# Patient Record
Sex: Female | Born: 1950 | Race: White | Hispanic: No | Marital: Single | Smoking: Never smoker
Health system: Southern US, Community
[De-identification: ages and names within clinical notes are randomized; demographics above are authoritative.]

## PROBLEM LIST (undated history)

## (undated) DIAGNOSIS — I10 Essential (primary) hypertension: Secondary | ICD-10-CM

## (undated) DIAGNOSIS — E079 Disorder of thyroid, unspecified: Secondary | ICD-10-CM

## (undated) DIAGNOSIS — I4891 Unspecified atrial fibrillation: Secondary | ICD-10-CM

## (undated) HISTORY — PX: COLON SURGERY: SHX602

## (undated) HISTORY — PX: THYROIDECTOMY, PARTIAL: SHX18

## (undated) HISTORY — PX: ABDOMINAL SURGERY: SHX537

## (undated) HISTORY — PX: ABDOMINAL HYSTERECTOMY: SHX81

## (undated) HISTORY — PX: OTHER SURGICAL HISTORY: SHX169

## (undated) HISTORY — PX: CARDIAC SURGERY: SHX584

## (undated) HISTORY — PX: BACK SURGERY: SHX140

## (undated) HISTORY — PX: HERNIA REPAIR: SHX51

## (undated) HISTORY — PX: JOINT REPLACEMENT: SHX530

---

## 2012-11-02 ENCOUNTER — Other Ambulatory Visit: Payer: Self-pay | Admitting: Obstetrics and Gynecology

## 2012-11-02 DIAGNOSIS — Z1231 Encounter for screening mammogram for malignant neoplasm of breast: Secondary | ICD-10-CM

## 2012-11-25 ENCOUNTER — Ambulatory Visit: Payer: Self-pay

## 2012-12-07 ENCOUNTER — Ambulatory Visit (INDEPENDENT_AMBULATORY_CARE_PROVIDER_SITE_OTHER): Payer: Federal, State, Local not specified - PPO

## 2012-12-07 DIAGNOSIS — Z1231 Encounter for screening mammogram for malignant neoplasm of breast: Secondary | ICD-10-CM

## 2013-11-22 ENCOUNTER — Other Ambulatory Visit: Payer: Self-pay | Admitting: Obstetrics and Gynecology

## 2013-11-22 DIAGNOSIS — Z1231 Encounter for screening mammogram for malignant neoplasm of breast: Secondary | ICD-10-CM

## 2013-11-23 ENCOUNTER — Other Ambulatory Visit: Payer: Self-pay | Admitting: Obstetrics and Gynecology

## 2013-12-08 ENCOUNTER — Ambulatory Visit (INDEPENDENT_AMBULATORY_CARE_PROVIDER_SITE_OTHER): Payer: Federal, State, Local not specified - PPO

## 2013-12-08 DIAGNOSIS — Z1231 Encounter for screening mammogram for malignant neoplasm of breast: Secondary | ICD-10-CM

## 2014-12-11 ENCOUNTER — Other Ambulatory Visit: Payer: Self-pay | Admitting: Obstetrics and Gynecology

## 2014-12-11 DIAGNOSIS — Z1231 Encounter for screening mammogram for malignant neoplasm of breast: Secondary | ICD-10-CM

## 2014-12-14 ENCOUNTER — Ambulatory Visit (INDEPENDENT_AMBULATORY_CARE_PROVIDER_SITE_OTHER): Payer: Federal, State, Local not specified - PPO

## 2014-12-14 DIAGNOSIS — Z1231 Encounter for screening mammogram for malignant neoplasm of breast: Secondary | ICD-10-CM

## 2015-11-28 ENCOUNTER — Other Ambulatory Visit: Payer: Self-pay | Admitting: Obstetrics and Gynecology

## 2015-11-28 DIAGNOSIS — Z1231 Encounter for screening mammogram for malignant neoplasm of breast: Secondary | ICD-10-CM

## 2015-12-26 ENCOUNTER — Ambulatory Visit (INDEPENDENT_AMBULATORY_CARE_PROVIDER_SITE_OTHER): Payer: Federal, State, Local not specified - PPO

## 2015-12-26 DIAGNOSIS — Z1231 Encounter for screening mammogram for malignant neoplasm of breast: Secondary | ICD-10-CM

## 2016-02-20 ENCOUNTER — Encounter: Payer: Self-pay | Admitting: Emergency Medicine

## 2016-02-20 ENCOUNTER — Emergency Department
Admission: EM | Admit: 2016-02-20 | Discharge: 2016-02-20 | Disposition: A | Discharge status: Home / Self Care | Payer: Federal, State, Local not specified - PPO | Source: Home / Self Care | Attending: Family Medicine | Admitting: Family Medicine

## 2016-02-20 DIAGNOSIS — J9801 Acute bronchospasm: Secondary | ICD-10-CM

## 2016-02-20 DIAGNOSIS — B9789 Other viral agents as the cause of diseases classified elsewhere: Secondary | ICD-10-CM

## 2016-02-20 DIAGNOSIS — J069 Acute upper respiratory infection, unspecified: Secondary | ICD-10-CM

## 2016-02-20 DIAGNOSIS — J028 Acute pharyngitis due to other specified organisms: Secondary | ICD-10-CM | POA: Diagnosis not present

## 2016-02-20 HISTORY — DX: Disorder of thyroid, unspecified: E07.9

## 2016-02-20 HISTORY — DX: Unspecified atrial fibrillation: I48.91

## 2016-02-20 HISTORY — DX: Essential (primary) hypertension: I10

## 2016-02-20 LAB — POCT RAPID STREP A (OFFICE): Rapid Strep A Screen: NEGATIVE

## 2016-02-20 MED ORDER — PREDNISONE 20 MG PO TABS: ORAL_TABLET | ORAL | 0 refills | Status: DC

## 2016-02-20 MED ORDER — DOXYCYCLINE HYCLATE 100 MG PO CAPS: 100.0000 mg | ORAL_CAPSULE | Freq: Two times a day (BID) | ORAL | 0 refills | Status: DC

## 2016-02-20 MED ORDER — BENZONATATE 200 MG PO CAPS: 200.0000 mg | ORAL_CAPSULE | Freq: Every day | ORAL | 0 refills | Status: DC

## 2016-02-20 NOTE — ED Provider Notes (Signed)
[facility]    CSN: [id number] Arrival date & time: 02/20/16  1307     History   Chief Complaint Chief Complaint  Patient presents with  . URI    HPI Jessy OtoBarbara Cuff is a 65 y.o. female.   Patient complains of three day history of typical cold-like symptoms developing over several days, including mild sore throat, sinus congestion, headache, fatigue, myalgias, and cough.  Today she has developed wheezing with cough.  She notes that she always has prolonged symptoms when she gets a cold, and in the past has been prescribed an albuterol inhaler and nebulizer for prn use.   The history is provided by the patient.    Past Medical History:  Diagnosis Date  . Hypertension   . Thyroid disease     There are no active problems to display for this patient.   Past Surgical History:  Procedure Laterality Date  . ABDOMINAL HYSTERECTOMY    . ABDOMINAL SURGERY    . BACK SURGERY    . COLON SURGERY    . HERNIA REPAIR    . JOINT REPLACEMENT    . THYROIDECTOMY, PARTIAL    . vaginal reconscruction      OB History    No data available       Home Medications    Prior to Admission medications   Medication Sig Start Date End Date Taking? Authorizing Provider  levothyroxine (SYNTHROID, LEVOTHROID) 75 MCG tablet Take 75 mcg by mouth daily before breakfast.   Yes Historical Provider, MD  losartan-hydrochlorothiazide (HYZAAR) 100-25 MG tablet Take 1 tablet by mouth daily.   Yes Historical Provider, MD  benzonatate (TESSALON) 200 MG capsule Take 1 capsule (200 mg total) by mouth at bedtime. Take as needed for cough 02/20/16   Lattie HawStephen A Rodert Hinch, MD  doxycycline (VIBRAMYCIN) 100 MG capsule Take 1 capsule (100 mg total) by mouth 2 (two) times daily. Take with food (Rx void after 02/28/16) 02/20/16   Lattie HawStephen A Nessie Nong, MD  predniSONE (DELTASONE) 20 MG tablet Take one tab by mouth twice daily for 5 days, then one daily. Take with food. 02/20/16   Lattie HawStephen A Jaymie Mckiddy, MD    Family  History Family History  Problem Relation Age of Onset  . Eosinophilic granuloma Mother   . Diabetes Father   . Hypertension Father   . COPD Father   . Heart failure Father     Social History Social History  Substance Use Topics  . Smoking status: Never Smoker  . Smokeless tobacco: Never Used  . Alcohol use No     Allergies   Codeine; Iodine; Macrobid [nitrofurantoin]; Morphine and related; and Penicillins   Review of Systems Review of Systems + sore throat + cough + sneezing No pleuritic pain + wheezing + nasal congestion + post-nasal drainage No sinus pain/pressure No itchy/red eyes ? earache No hemoptysis No SOB No fever, + chills No nausea No vomiting No abdominal pain No diarrhea No urinary symptoms No skin rash + fatigue + myalgias + headache Used OTC meds without relief   Physical Exam Triage Vital Signs ED Triage Vitals  Enc Vitals Group     BP 02/20/16 1336 145/90     Pulse Rate 02/20/16 1336 93     Resp --      Temp 02/20/16 1336 98.2 F (36.8 C)     Temp Source 02/20/16 1336 Oral     SpO2 02/20/16 1336 96 %     Weight 02/20/16 1337 239 lb (108.4  kg)     Height 02/20/16 1337 5\' 2"  (1.575 m)     Head Circumference --      Peak Flow --      Pain Score 02/20/16 1350 2     Pain Loc --      Pain Edu? --      Excl. in GC? --    No data found.   Updated Vital Signs BP 145/90 (BP Location: Left Arm)   Pulse 93   Temp 98.2 F (36.8 C) (Oral)   Ht 5\' 2"  (1.575 m)   Wt 239 lb (108.4 kg)   SpO2 96%   BMI 43.71 kg/m   Visual Acuity Right Eye Distance:   Left Eye Distance:   Bilateral Distance:    Right Eye Near:   Left Eye Near:    Bilateral Near:     Physical Exam Nursing notes and Vital Signs reviewed. Appearance:  Patient appears stated age, and in no acute distress Eyes:  Pupils are equal, round, and reactive to light and accomodation.  Extraocular movement is intact.  Conjunctivae are not inflamed  Ears:  Canals normal.   Tympanic membranes normal.  Nose:  Mildly congested turbinates.  No sinus tenderness.  Pharynx:  Uvula erythematous, otherwise normal. Neck:  Supple.  Tender enlarged posterior/lateral nodes are palpated bilaterally.  Tonsillar nodes also tender but not enlarged.  Lungs:  Clear to auscultation.  Breath sounds are equal.  Moving air well. Heart:  Regular rate and rhythm without murmurs, rubs, or gallops.  Abdomen:  Nontender without masses or hepatosplenomegaly.  Bowel sounds are present.  No CVA or flank tenderness.  Extremities:  No edema.  Skin:  No rash present.    UC Treatments / Results  Labs (all labs ordered are listed, but only abnormal results are displayed) Labs Reviewed  STREP A DNA PROBE negative  POCT RAPID STREP A (OFFICE)     EKG  EKG Interpretation None       Radiology No results found.  Procedures Procedures (including critical care time)  Medications Ordered in UC Medications - No data to display   Initial Impression / Assessment and Plan / UC Course  I have reviewed the triage vital signs and the nursing notes.  Pertinent labs & imaging results that were available during my care of the patient were reviewed by me and considered in my medical decision making (see chart for details).  Clinical Course  Suspect an element of reactive airways disease that only manifests during a viral URI There is no evidence of bacterial infection today.   Begin prednisone burst/taper.  Prescription written for Benzonatate [facility]) to take at bedtime for night-time cough.  Take plain guaifenesin (1200mg  extended release tabs such as Mucinex) twice daily, with plenty of water, for cough and congestion. Get adequate rest.   Also recommend using saline nasal spray several times daily and saline nasal irrigation (AYR is a common brand).   Use vaporizer by bedside. Try warm salt water gargles for sore throat.  Stop all antihistamines for now, and other non-prescription  cough/cold preparations. Use albuterol inhaler as needed. Begin doxycycline if not improving about one week or if persistent fever develops (Given a prescription to hold, with an expiration date)  Follow-up with family doctor if not improving about10 days.      Final Clinical Impressions(s) / UC Diagnoses   Final diagnoses:  Pharyngitis due to other organism  Viral URI with cough  Bronchospasm    New Prescriptions  New Prescriptions   BENZONATATE (TESSALON) 200 MG CAPSULE    Take 1 capsule (200 mg total) by mouth at bedtime. Take as needed for cough   DOXYCYCLINE (VIBRAMYCIN) 100 MG CAPSULE    Take 1 capsule (100 mg total) by mouth 2 (two) times daily. Take with food (Rx void after 02/28/16)   PREDNISONE (DELTASONE) 20 MG TABLET    Take one tab by mouth twice daily for 5 days, then one daily. Take with food.     Lattie HawStephen A Jaqualyn Juday, MD 02/20/16 1501

## 2016-02-20 NOTE — ED Triage Notes (Signed)
Bi-lateral ear pain, cough, eyes watery, sinus pressure, neck aches x 3 days

## 2016-02-20 NOTE — Discharge Instructions (Signed)
Take plain guaifenesin (1200mg  extended release tabs such as Mucinex) twice daily, with plenty of water, for cough and congestion. Get adequate rest.   Also recommend using saline nasal spray several times daily and saline nasal irrigation (AYR is a common brand).   Use vaporizer by bedside. Try warm salt water gargles for sore throat.  Stop all antihistamines for now, and other non-prescription cough/cold preparations. Use albuterol inhaler as needed. Begin doxycycline if not improving about one week or if persistent fever develops   Follow-up with family doctor if not improving about10 days.

## 2016-02-21 LAB — STREP A DNA PROBE: GASP: NOT DETECTED

## 2016-02-22 ENCOUNTER — Telehealth: Payer: Self-pay | Admitting: *Deleted

## 2016-02-22 NOTE — Telephone Encounter (Signed)
Callback: Patients advised of negative TCX results.

## 2016-12-10 ENCOUNTER — Other Ambulatory Visit: Payer: Self-pay | Admitting: Obstetrics and Gynecology

## 2016-12-10 DIAGNOSIS — Z1231 Encounter for screening mammogram for malignant neoplasm of breast: Secondary | ICD-10-CM

## 2017-01-02 ENCOUNTER — Ambulatory Visit (INDEPENDENT_AMBULATORY_CARE_PROVIDER_SITE_OTHER): Payer: Federal, State, Local not specified - PPO

## 2017-01-02 DIAGNOSIS — Z1231 Encounter for screening mammogram for malignant neoplasm of breast: Secondary | ICD-10-CM | POA: Diagnosis not present

## 2017-03-05 ENCOUNTER — Emergency Department
Admission: EM | Admit: 2017-03-05 | Discharge: 2017-03-05 | Disposition: A | Discharge status: Home / Self Care | Payer: Federal, State, Local not specified - PPO | Source: Home / Self Care | Attending: Family Medicine | Admitting: Family Medicine

## 2017-03-05 ENCOUNTER — Other Ambulatory Visit: Payer: Self-pay

## 2017-03-05 ENCOUNTER — Encounter: Payer: Self-pay | Admitting: Emergency Medicine

## 2017-03-05 DIAGNOSIS — B9789 Other viral agents as the cause of diseases classified elsewhere: Secondary | ICD-10-CM | POA: Diagnosis not present

## 2017-03-05 DIAGNOSIS — J069 Acute upper respiratory infection, unspecified: Secondary | ICD-10-CM | POA: Diagnosis not present

## 2017-03-05 MED ORDER — PREDNISONE 20 MG PO TABS
ORAL_TABLET | ORAL | 0 refills | Status: DC
Start: 2017-03-05 — End: 2022-01-10

## 2017-03-05 MED ORDER — CEFDINIR 300 MG PO CAPS: 300.0000 mg | ORAL_CAPSULE | Freq: Two times a day (BID) | ORAL | 0 refills | Status: DC

## 2017-03-05 NOTE — Discharge Instructions (Signed)
Take plain guaifenesin (1200mg  extended release tabs such as Mucinex) twice daily, with plenty of water, for cough and congestion.  May add Pseudoephedrine (30mg , one or two every 4 to 6 hours) for sinus congestion.  Get adequate rest.   May use Afrin nasal spray (or generic oxymetazoline) each morning for about 5 days and then discontinue.  Also recommend using saline nasal spray several times daily and saline nasal irrigation (AYR is a common brand).  Use Flonase nasal spray each morning after using Afrin nasal spray and saline nasal irrigation. Try warm salt water gargles for sore throat.  Stop all antihistamines for now, and other non-prescription cough/cold preparations. May take Delsym Cough Suppressant at bedtime for nighttime cough.  Begin Cefdinir if not improving about one week or if persistent fever develops   Follow-up with family doctor if not improving about10 days.

## 2017-03-05 NOTE — ED Provider Notes (Signed)
[facility]    CSN: [id number] Arrival date & time: 03/05/17  1319     History   Chief Complaint Chief Complaint  Patient presents with  . Sinus Problem    HPI Leah Mclaughlin is a 66 y.o. female.   Four days ago patient developed soreness in her neck, fatigue scratchy throat, and sinus congestion.  Her congestion has become worse, and today she has developed low grade fever and bilateral ear fullness.  No cough.  She has a history of recurrent ear and sinus infections.   The history is provided by the patient.    Past Medical History:  Diagnosis Date  . A-fib (HCC)   . Hypertension   . Thyroid disease     There are no active problems to display for this patient.   Past Surgical History:  Procedure Laterality Date  . ABDOMINAL HYSTERECTOMY    . ABDOMINAL SURGERY    . BACK SURGERY    . CARDIAC SURGERY    . COLON SURGERY    . HERNIA REPAIR    . JOINT REPLACEMENT    . THYROIDECTOMY, PARTIAL    . vaginal reconscruction      OB History    No data available       Home Medications    Prior to Admission medications   Medication Sig Start Date End Date Taking? Authorizing Provider  cefdinir (OMNICEF) 300 MG capsule Take 1 capsule (300 mg total) 2 (two) times daily by mouth. (Rx void after 03/13/17) 03/05/17   Lattie HawBeese, Imojean Yoshino A, MD  levothyroxine (SYNTHROID, LEVOTHROID) 75 MCG tablet Take 75 mcg by mouth daily before breakfast.    [provider]  losartan-hydrochlorothiazide (HYZAAR) 100-25 MG tablet Take 1 tablet by mouth daily.    [provider]  predniSONE (DELTASONE) 20 MG tablet Take one tab by mouth twice daily for 4 days, then one daily for 3 days. Take with food. 03/05/17   Lattie HawBeese, Taylin Leder A, MD    Family History Family History  Problem Relation Age of Onset  . Eosinophilic granuloma Mother   . Diabetes Father   . Hypertension Father   . COPD Father   . Heart failure Father     Social History Social History    Tobacco Use  . Smoking status: Never Smoker  . Smokeless tobacco: Never Used  Substance Use Topics  . Alcohol use: No  . Drug use: No     Allergies   Codeine; Iodine; Macrobid [nitrofurantoin]; Morphine and related; and Penicillins   Review of Systems Review of Systems + sore throat No cough No pleuritic pain No wheezing + nasal congestion + post-nasal drainage + sinus pain/pressure No itchy/red eyes + earache No hemoptysis No SOB + low grade fever, + chills No nausea No vomiting No abdominal pain No diarrhea No urinary symptoms No skin rash + fatigue + mild myalgias + headache Used OTC meds without relief   Physical Exam Triage Vital Signs ED Triage Vitals  Enc Vitals Group     BP 03/05/17 1339 (!) 162/99     Pulse Rate 03/05/17 1339 77     Resp --      Temp 03/05/17 1339 97.9 F (36.6 C)     Temp Source 03/05/17 1339 Oral     SpO2 03/05/17 1339 96 %     Weight 03/05/17 1340 172 lb (78 kg)     Height 03/05/17 1340 5' (1.524 m)     Head Circumference --  Peak Flow --      Pain Score 03/05/17 1340 3     Pain Loc --      Pain Edu? --      Excl. in GC? --    No data found.  Updated Vital Signs BP (!) 162/99 (BP Location: Right Arm)   Pulse 77   Temp 97.9 F (36.6 C) (Oral)   Ht 5' (1.524 m)   Wt 172 lb (78 kg)   SpO2 96%   BMI 33.59 kg/m   Visual Acuity Right Eye Distance:   Left Eye Distance:   Bilateral Distance:    Right Eye Near:   Left Eye Near:    Bilateral Near:     Physical Exam Nursing notes and Vital Signs reviewed. Appearance:  Patient appears stated age, and in no acute distress Eyes:  Pupils are equal, round, and reactive to light and accomodation.  Extraocular movement is intact.  Conjunctivae are not inflamed  Ears:  Canals normal.  Tympanic membranes normal.  Nose:  Congested turbinates.  No sinus tenderness.   Pharynx:  Normal Neck:  Supple.  Enlarged posterior/lateral nodes are palpated bilaterally, tender  to palpation on the left.   Lungs:  Clear to auscultation.  Breath sounds are equal.  Moving air well. Heart:  Regular rate and rhythm without murmurs, rubs, or gallops.  Abdomen:  Nontender without masses or hepatosplenomegaly.  Bowel sounds are present.  No CVA or flank tenderness.  Extremities:  No edema.  Skin:  No rash present.    UC Treatments / Results  Labs (all labs ordered are listed, but only abnormal results are displayed) Labs Reviewed - No data to display  EKG  EKG Interpretation None       Radiology No results found.  Procedures Procedures (including critical care time)  Medications Ordered in UC Medications - No data to display   Initial Impression / Assessment and Plan / UC Course  I have reviewed the triage vital signs and the nursing notes.  Pertinent labs & imaging results that were available during my care of the patient were reviewed by me and considered in my medical decision making (see chart for details).    There is no evidence of bacterial infection today.   Because patient has history of recurrent ear and sinus infections, will begin prednisone burst/taper. Take plain guaifenesin (1200mg  extended release tabs such as Mucinex) twice daily, with plenty of water, for cough and congestion.  May add Pseudoephedrine (30mg , one or two every 4 to 6 hours) for sinus congestion.  Get adequate rest.   May use Afrin nasal spray (or generic oxymetazoline) each morning for about 5 days and then discontinue.  Also recommend using saline nasal spray several times daily and saline nasal irrigation (AYR is a common brand).  Use Flonase nasal spray each morning after using Afrin nasal spray and saline nasal irrigation. Try warm salt water gargles for sore throat.  Stop all antihistamines for now, and other non-prescription cough/cold preparations. May take Delsym Cough Suppressant at bedtime for nighttime cough.  Begin Cefdinir if not improving about one week or if  persistent fever develops (Given a prescription to hold, with an expiration date).  Note that patient has had no adverse effects from Keflex in the past. Follow-up with family doctor if not improving about10 days.     Final Clinical Impressions(s) / UC Diagnoses   Final diagnoses:  Viral URI with cough    ED Discharge Orders  Ordered    predniSONE (DELTASONE) 20 MG tablet     03/05/17 1400    cefdinir (OMNICEF) 300 MG capsule  2 times daily     03/05/17 1402          Lattie Haw, MD 03/05/17 1534

## 2017-03-05 NOTE — ED Triage Notes (Signed)
Sinus pain, pressure, facial pain, bi-lateral ear pain, runny nose x 3 days

## 2017-12-02 ENCOUNTER — Other Ambulatory Visit: Payer: Self-pay | Admitting: Obstetrics and Gynecology

## 2017-12-02 DIAGNOSIS — Z1231 Encounter for screening mammogram for malignant neoplasm of breast: Secondary | ICD-10-CM

## 2018-01-07 ENCOUNTER — Ambulatory Visit (INDEPENDENT_AMBULATORY_CARE_PROVIDER_SITE_OTHER): Payer: Federal, State, Local not specified - PPO

## 2018-01-07 DIAGNOSIS — Z1231 Encounter for screening mammogram for malignant neoplasm of breast: Secondary | ICD-10-CM

## 2018-12-30 ENCOUNTER — Other Ambulatory Visit: Payer: Self-pay | Admitting: Obstetrics and Gynecology

## 2018-12-30 DIAGNOSIS — Z1231 Encounter for screening mammogram for malignant neoplasm of breast: Secondary | ICD-10-CM

## 2019-01-27 ENCOUNTER — Ambulatory Visit (INDEPENDENT_AMBULATORY_CARE_PROVIDER_SITE_OTHER): Payer: Medicare Other

## 2019-01-27 ENCOUNTER — Other Ambulatory Visit: Payer: Self-pay

## 2019-01-27 DIAGNOSIS — Z1231 Encounter for screening mammogram for malignant neoplasm of breast: Secondary | ICD-10-CM

## 2020-06-19 ENCOUNTER — Other Ambulatory Visit: Payer: Self-pay | Admitting: Obstetrics and Gynecology

## 2020-06-19 DIAGNOSIS — Z1231 Encounter for screening mammogram for malignant neoplasm of breast: Secondary | ICD-10-CM

## 2020-06-22 ENCOUNTER — Ambulatory Visit: Payer: Medicare Other

## 2020-06-28 ENCOUNTER — Other Ambulatory Visit: Payer: Self-pay

## 2020-06-28 ENCOUNTER — Ambulatory Visit (INDEPENDENT_AMBULATORY_CARE_PROVIDER_SITE_OTHER): Payer: Medicare Other

## 2020-06-28 DIAGNOSIS — Z1231 Encounter for screening mammogram for malignant neoplasm of breast: Secondary | ICD-10-CM | POA: Diagnosis not present

## 2021-06-10 ENCOUNTER — Other Ambulatory Visit: Payer: Self-pay | Admitting: Obstetrics and Gynecology

## 2021-06-10 DIAGNOSIS — Z1231 Encounter for screening mammogram for malignant neoplasm of breast: Secondary | ICD-10-CM

## 2021-07-04 ENCOUNTER — Other Ambulatory Visit: Payer: Self-pay

## 2021-07-04 ENCOUNTER — Ambulatory Visit: Payer: Medicare Other

## 2021-07-04 ENCOUNTER — Ambulatory Visit (INDEPENDENT_AMBULATORY_CARE_PROVIDER_SITE_OTHER): Payer: Medicare Other

## 2021-07-04 DIAGNOSIS — Z1231 Encounter for screening mammogram for malignant neoplasm of breast: Secondary | ICD-10-CM

## 2022-01-10 ENCOUNTER — Ambulatory Visit
Admission: EM | Admit: 2022-01-10 | Discharge: 2022-01-10 | Disposition: A | Discharge status: Home / Self Care | Payer: Federal, State, Local not specified - PPO

## 2022-01-10 ENCOUNTER — Telehealth: Payer: Self-pay | Admitting: Urgent Care

## 2022-01-10 ENCOUNTER — Encounter: Payer: Self-pay | Admitting: *Deleted

## 2022-01-10 DIAGNOSIS — U071 COVID-19: Secondary | ICD-10-CM

## 2022-01-10 DIAGNOSIS — J029 Acute pharyngitis, unspecified: Secondary | ICD-10-CM

## 2022-01-10 DIAGNOSIS — M26609 Unspecified temporomandibular joint disorder, unspecified side: Secondary | ICD-10-CM | POA: Diagnosis not present

## 2022-01-10 DIAGNOSIS — H6983 Other specified disorders of Eustachian tube, bilateral: Secondary | ICD-10-CM

## 2022-01-10 LAB — POC SARS CORONAVIRUS 2 AG -  ED: SARS Coronavirus 2 Ag: POSITIVE — AB

## 2022-01-10 LAB — POC INFLUENZA A AND B ANTIGEN (URGENT CARE ONLY)
Influenza A Ag: NEGATIVE
Influenza B Ag: NEGATIVE

## 2022-01-10 LAB — POCT RAPID STREP A (OFFICE): Rapid Strep A Screen: NEGATIVE

## 2022-01-10 MED ORDER — MOLNUPIRAVIR EUA 200MG CAPSULE: 4.0000 | ORAL_CAPSULE | Freq: Two times a day (BID) | ORAL | 0 refills | Status: DC

## 2022-01-10 MED ORDER — MOLNUPIRAVIR EUA 200MG CAPSULE: 4.0000 | ORAL_CAPSULE | Freq: Two times a day (BID) | ORAL | 0 refills | Status: AC

## 2022-01-10 MED ORDER — BACLOFEN 10 MG PO TABS: 10.0000 mg | ORAL_TABLET | Freq: Three times a day (TID) | ORAL | 0 refills | Status: AC

## 2022-01-10 MED ORDER — FLUTICASONE PROPIONATE 50 MCG/ACT NA SUSP: 1.0000 | Freq: Every day | NASAL | 0 refills | Status: AC

## 2022-01-10 MED ORDER — MELOXICAM 7.5 MG PO TABS: 7.5000 mg | ORAL_TABLET | Freq: Every day | ORAL | 0 refills | Status: AC

## 2022-01-10 NOTE — ED Provider Notes (Signed)
[facility]    CSN: [id number] Arrival date & time: 01/10/22  0806      History   Chief Complaint Chief Complaint  Patient presents with   Sore Throat   Otalgia    HPI Leah Mclaughlin is a 71 y.o. female.   Pleasant 71 year old female presents today due to concerns of a 2 to 84-month history of left sided neck/ear pain, in combination with an acute onset of a sore throat last evening.  She states she saw her PCP and had a thyroid ultrasound performed on September 1 to further evaluate her neck pain.  She had a thyroidectomy 30 years ago, and was concerned that possibly her pain was related to a thyroid issue.  The ultrasound showed blood flow consistent with residual thyroid tissue, but no other acute findings noted.  Patient admits in addition to her neck pain, she has chronic left ear pain, and intermittent headaches.  She admits to clenching her teeth, is uncertain if she grinds at night.  Believes she has TMJ, but has never been treated.  States over the past 24 hours, she felt the development of a sore throat and change in voice as well.  Denies sick contacts or fever.  Has not found relief for her chronic neck and ear pain.     Sore Throat  Otalgia   Past Medical History:  Diagnosis Date   A-fib [facility])    Hypertension    Thyroid disease     There are no problems to display for this patient.   Past Surgical History:  Procedure Laterality Date   ABDOMINAL HYSTERECTOMY     ABDOMINAL SURGERY     BACK SURGERY     CARDIAC SURGERY     COLON SURGERY     HERNIA REPAIR     JOINT REPLACEMENT     THYROIDECTOMY, PARTIAL     vaginal reconscruction      OB History   No obstetric history on file.      Home Medications    Prior to Admission medications   Medication Sig Start Date End Date Taking? Authorizing Provider  baclofen (LIORESAL) 10 MG tablet Take 1 tablet (10 mg total) by mouth 3 (three) times daily for 10 days. 01/10/22 01/20/22 Yes Jolette Lana  L, PA  fluticasone (FLONASE) 50 MCG/ACT nasal spray Place 1 spray into both nostrils daily. 01/10/22  Yes Rylan Bernard L, PA  meloxicam (MOBIC) 7.5 MG tablet Take 1 tablet (7.5 mg total) by mouth daily for 7 days. 01/10/22 01/17/22 Yes Asberry Lascola L, PA  molnupiravir EUA (LAGEVRIO) 200 mg CAPS capsule Take 4 capsules (800 mg total) by mouth 2 (two) times daily for 5 days. 01/10/22 01/15/22 Yes Nery Kalisz L, PA  levothyroxine (SYNTHROID, LEVOTHROID) 75 MCG tablet Take 75 mcg by mouth daily before breakfast.    [provider]  losartan-hydrochlorothiazide (HYZAAR) 100-25 MG tablet Take 1 tablet by mouth daily.    [provider]  trimethoprim (TRIMPEX) 100 MG tablet Take 100 mg by mouth daily. 09/29/21   [provider]    Family History Family History  Problem Relation Age of Onset   Eosinophilic granuloma Mother    Diabetes Father    Hypertension Father    COPD Father    Heart failure Father    Breast cancer Paternal Grandmother     Social History Social History   Tobacco Use   Smoking status: Never   Smokeless tobacco: Never  Substance Use Topics  Alcohol use: No   Drug use: No     Allergies   Codeine, Iodine, Macrobid [nitrofurantoin], Morphine and related, and Penicillins   Review of Systems Review of Systems  HENT:  Positive for ear pain.   As per HPI   Physical Exam Triage Vital Signs ED Triage Vitals  Enc Vitals Group     BP 01/10/22 0815 (!) 168/90     Pulse Rate 01/10/22 0815 79     Resp 01/10/22 0815 16     Temp 01/10/22 0815 99.2 F (37.3 C)     Temp Source 01/10/22 0815 Oral     SpO2 01/10/22 0815 97 %     Weight --      Height --      Head Circumference --      Peak Flow --      Pain Score 01/10/22 0818 8     Pain Loc --      Pain Edu? --      Excl. in GC? --    No data found.  Updated Vital Signs BP (!) 140/86 (BP Location: Right Arm)   Pulse 65   Temp 99.2 F (37.3 C) (Oral)   Resp 16   SpO2 99%    Visual Acuity Right Eye Distance:   Left Eye Distance:   Bilateral Distance:    Right Eye Near:   Left Eye Near:    Bilateral Near:     Physical Exam Vitals and nursing note reviewed.  Constitutional:      General: She is not in acute distress.    Appearance: She is well-developed.  HENT:     Head: Normocephalic and atraumatic.     Jaw: There is normal jaw occlusion. Tenderness and pain on movement present.     Comments: Subluxation bilateral TMJ    Right Ear: Tympanic membrane and ear canal normal. No drainage, swelling or tenderness. No middle ear effusion. Tympanic membrane is not erythematous.     Left Ear: Tympanic membrane and ear canal normal. No drainage, swelling or tenderness.  No middle ear effusion. Tympanic membrane is not erythematous.     Nose: No congestion or rhinorrhea.     Mouth/Throat:     Mouth: Mucous membranes are moist. No oral lesions.     Pharynx: Oropharynx is clear. Uvula midline. Posterior oropharyngeal erythema (minimal posterior pharynx) present. No pharyngeal swelling, oropharyngeal exudate or uvula swelling.     Tonsils: No tonsillar exudate or tonsillar abscesses.  Eyes:     Extraocular Movements:     Right eye: Normal extraocular motion.     Left eye: Normal extraocular motion.     Conjunctiva/sclera: Conjunctivae normal.     Pupils: Pupils are equal, round, and reactive to light.  Neck:     Comments: Well healed surgical scar anterior neck Cardiovascular:     Rate and Rhythm: Normal rate and regular rhythm.     Heart sounds: No murmur heard. Pulmonary:     Effort: Pulmonary effort is normal. No respiratory distress.     Breath sounds: Normal breath sounds. No stridor. No wheezing or rhonchi.  Abdominal:     Palpations: Abdomen is soft.     Tenderness: There is no abdominal tenderness.  Musculoskeletal:        General: No swelling.     Cervical back: Normal range of motion and neck supple.  Lymphadenopathy:     Cervical: No cervical  adenopathy.  Skin:    General: Skin is warm  and dry.     Capillary Refill: Capillary refill takes less than 2 seconds.     Findings: No erythema or rash.  Neurological:     General: No focal deficit present.     Mental Status: She is alert and oriented to person, place, and time.  Psychiatric:        Mood and Affect: Mood normal.      UC Treatments / Results  Labs (all labs ordered are listed, but only abnormal results are displayed) Labs Reviewed  POC SARS CORONAVIRUS 2 AG -  ED - Abnormal; Notable for the following components:      Result Value   SARS Coronavirus 2 Ag Positive (*)    All other components within normal limits  POCT RAPID STREP A (OFFICE)  POC INFLUENZA A AND B ANTIGEN (URGENT CARE ONLY)    EKG   Radiology No results found.  Procedures Procedures (including critical care time)  Medications Ordered in UC Medications - No data to display  Initial Impression / Assessment and Plan / UC Course  I have reviewed the triage vital signs and the nursing notes.  Pertinent labs & imaging results that were available during my care of the patient were reviewed by me and considered in my medical decision making (see chart for details).   Around 8:53 AM, patient was being discharged, reported feeling nauseated, clammy and dizzy.  Vital signs rechecked, which were unremarkable.  Patient requesting COVID and flu testing given her acute onset of symptoms while in office. Discharge instructions re-printed and handed to patient as she was positive for Covid.  Sore throat - rapid strep negative.  Suspect viral, versus postnasal drainage.  Supportive measures appropriate. Eustachian tube dysfunction -patient is pointing to the eustachian tube on the left as the main source of her pain for the past several years.  Admits to intermittent popping and dizziness on the left as well.  We will do a trial of Flonase to see if we can open up the eustachian tube. TMJ -subluxation noted  bilaterally, worse on the left.  Suspect in part this to be the cause of the headaches ear pain and neck pain.  We will do short course of meloxicam given patient's history of gastric sleeve.  Recommended continued use of PPI.  We will also add baclofen.  Continue moist warm compresses to the left jaw and ear.  Follow-up with dentist for additional recommendations. COVID -positive in office test.  This may be the causative agent for #1.  Supportive measures.  Patient meets criteria for antiviral therapy, handout provided.  Discussed emergency use authorization only, not FDA approved.  Anticipated outcome discussed.   Final Clinical Impressions(s) / UC Diagnoses   Final diagnoses:  Sore throat  Dysfunction of both eustachian tubes  TMJ (temporomandibular joint syndrome)  COVID     Discharge Instructions      Your strep test is negative. Your flu test is negative, but your covid test is positive. I suspect your ear pain to be related to both eustachian tube dysfunction and TMJ Please read the attached handout on TMJ. Consider follow up with a dentist to discuss a night guard or other treatment options. Use a warm moist compress to your right ear/jaw to help.      ED Prescriptions     Medication Sig Dispense Auth. Provider   meloxicam (MOBIC) 7.5 MG tablet Take 1 tablet (7.5 mg total) by mouth daily for 7 days. 7 tablet [place], Deshunda Thackston L,  PA   baclofen (LIORESAL) 10 MG tablet Take 1 tablet (10 mg total) by mouth 3 (three) times daily for 10 days. 30 each Jayln Branscom L, PA   fluticasone (FLONASE) 50 MCG/ACT nasal spray Place 1 spray into both nostrils daily. 16 mL [place]on Klas L, PA   molnupiravir EUA (LAGEVRIO) 200 mg CAPS capsule Take 4 capsules (800 mg total) by mouth 2 (two) times daily for 5 days. 40 capsule Ronia Hazelett L, PA      PDMP not reviewed this encounter.   Chaney Malling, [place] 01/10/22 [phone]

## 2022-01-10 NOTE — Telephone Encounter (Signed)
Pt called to notify us that her pharmacy would not have molnupiravir for 5 days and requested we call it in to [facility] [address] instead.

## 2022-01-10 NOTE — ED Notes (Addendum)
At time of discharge patient reported feeling nauseous, clammy and dizzy. V/s WNL. Gave her a diet gingerale and monitored until symptoms improved. Provider ordered poct flu and covid tests.

## 2022-01-10 NOTE — Discharge Instructions (Addendum)
Your strep test is negative. Your flu test is negative, but your covid test is positive. I suspect your ear pain to be related to both eustachian tube dysfunction and TMJ Please read the attached handout on TMJ. Consider follow up with a dentist to discuss a night guard or other treatment options. Use a warm moist compress to your right ear/jaw to help.

## 2022-01-10 NOTE — ED Triage Notes (Signed)
Patient c/o bilateral ear pain and sore throat x last night. Taken IBF and tylenol last night without relief. She reports she has residual tissue in her neck from a thyroidectomy that causes chronic. ear pain.

## 2022-06-09 ENCOUNTER — Other Ambulatory Visit: Payer: Self-pay | Admitting: Obstetrics and Gynecology

## 2022-06-09 DIAGNOSIS — Z1231 Encounter for screening mammogram for malignant neoplasm of breast: Secondary | ICD-10-CM

## 2022-07-09 ENCOUNTER — Ambulatory Visit: Payer: Medicare Other

## 2022-08-04 IMAGING — MG MM DIGITAL SCREENING BILAT W/ TOMO AND CAD
8 series · 8 of 24 positions shown · non-contrast
Comparison: Previous exam(s).

CLINICAL DATA: Screening.

EXAM:
DIGITAL SCREENING BILATERAL MAMMOGRAM WITH TOMOSYNTHESIS AND CAD
TECHNIQUE: Bilateral screening digital craniocaudal and mediolateral oblique
mammograms were obtained. Bilateral screening digital breast
tomosynthesis was performed. The images were evaluated with
computer-aided detection.

[L CC synth-2D]
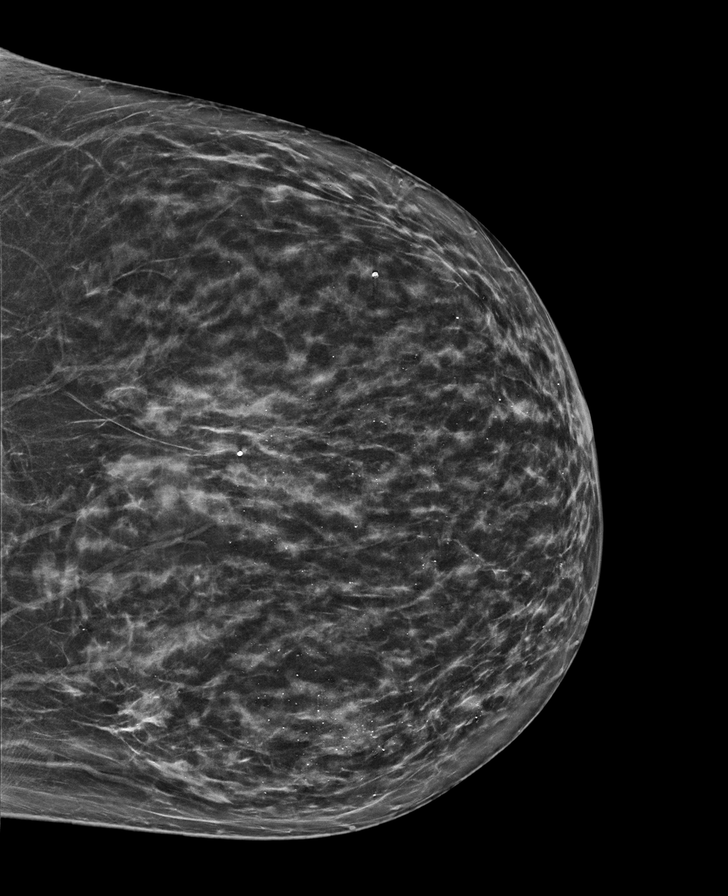

[L MLO synth-2D]
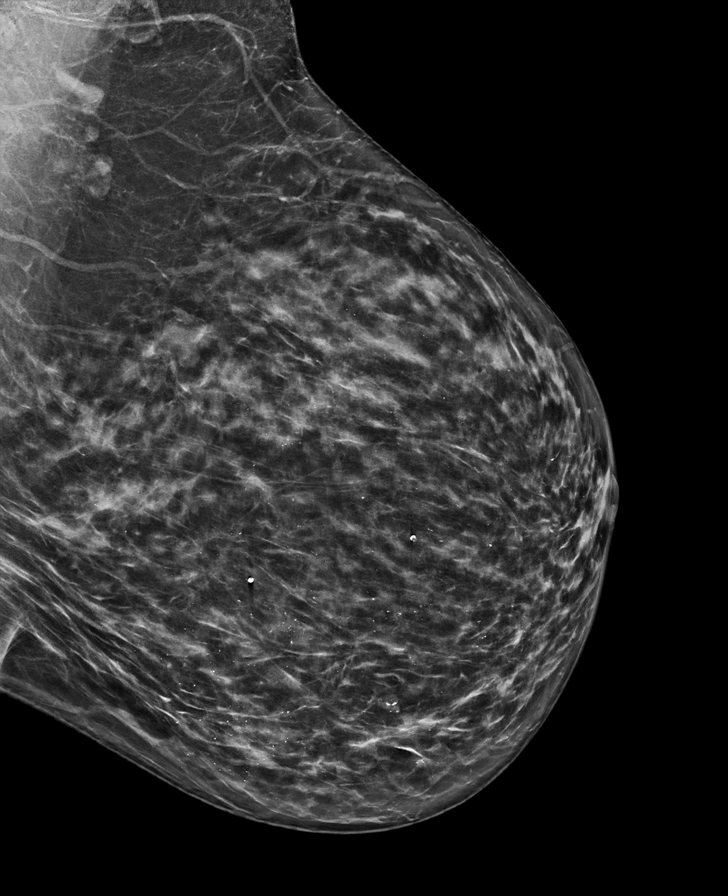

[R MLO synth-2D]
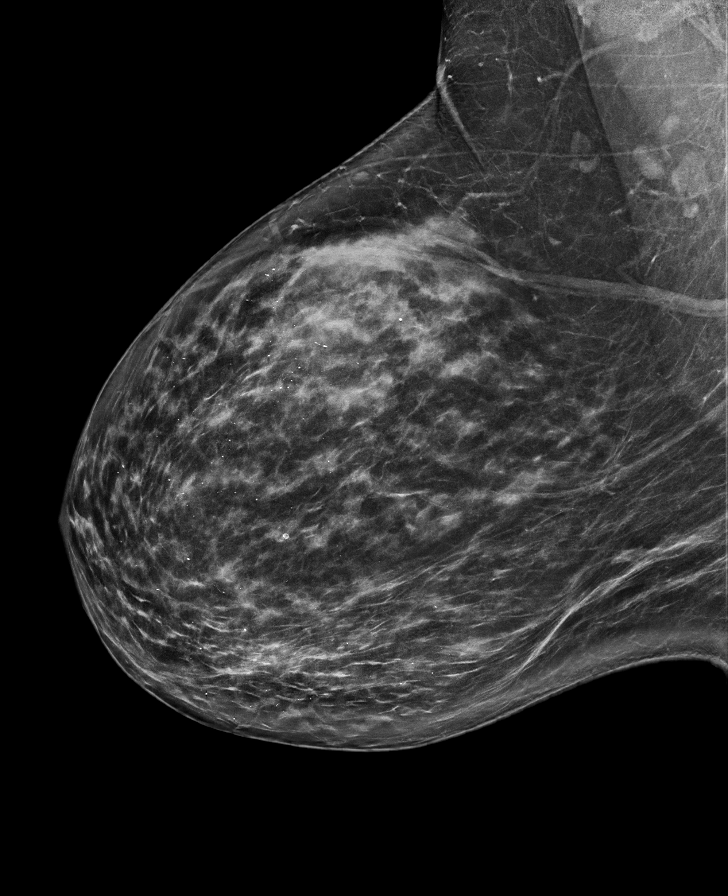

[R CC synth-2D]
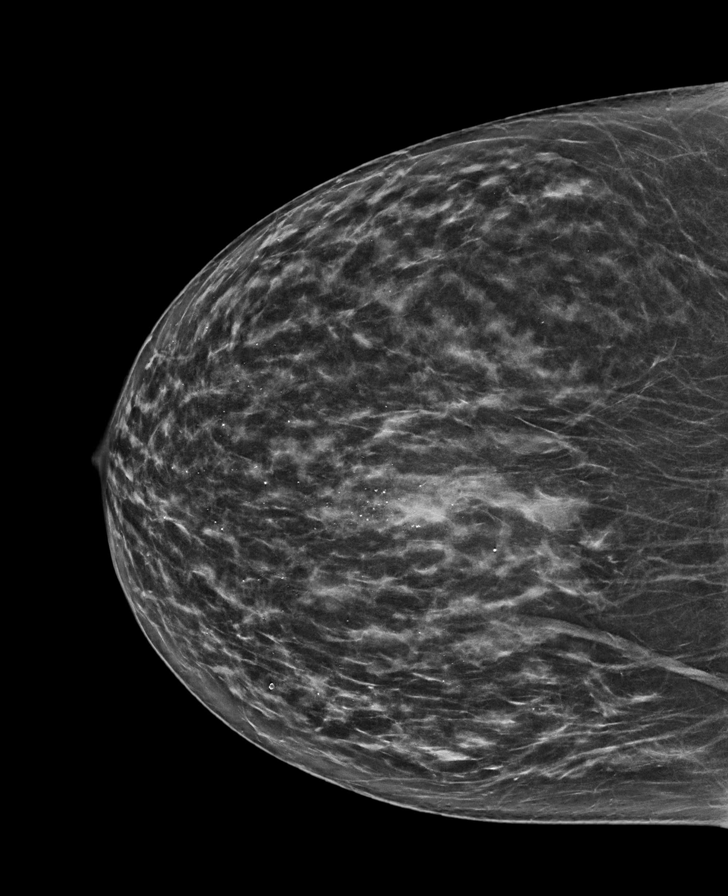

[L CC tomo · tomo slice 34/67.0]
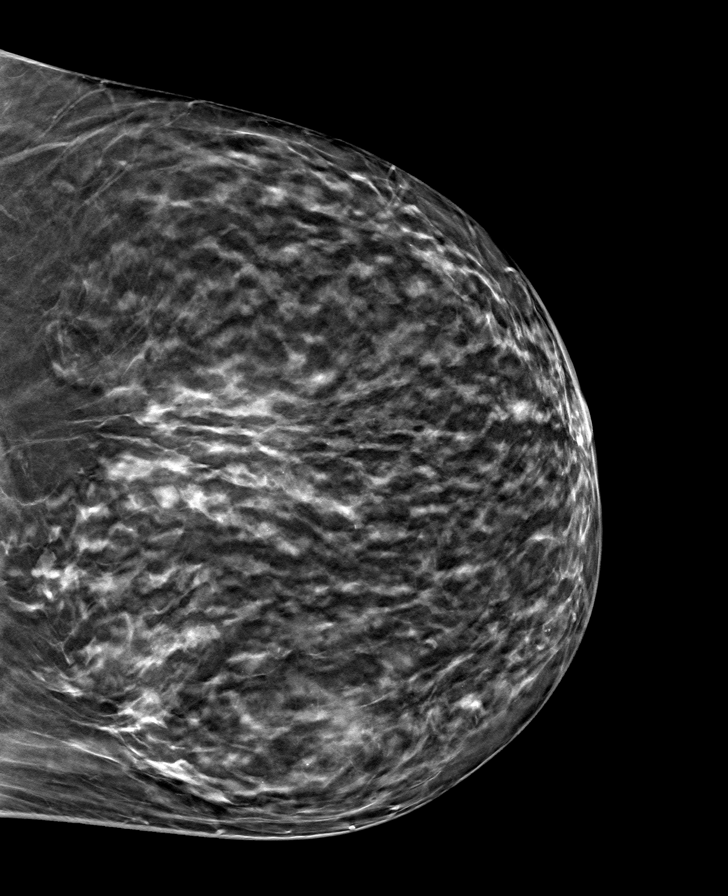

[R MLO tomo · tomo slice 40/79.0]
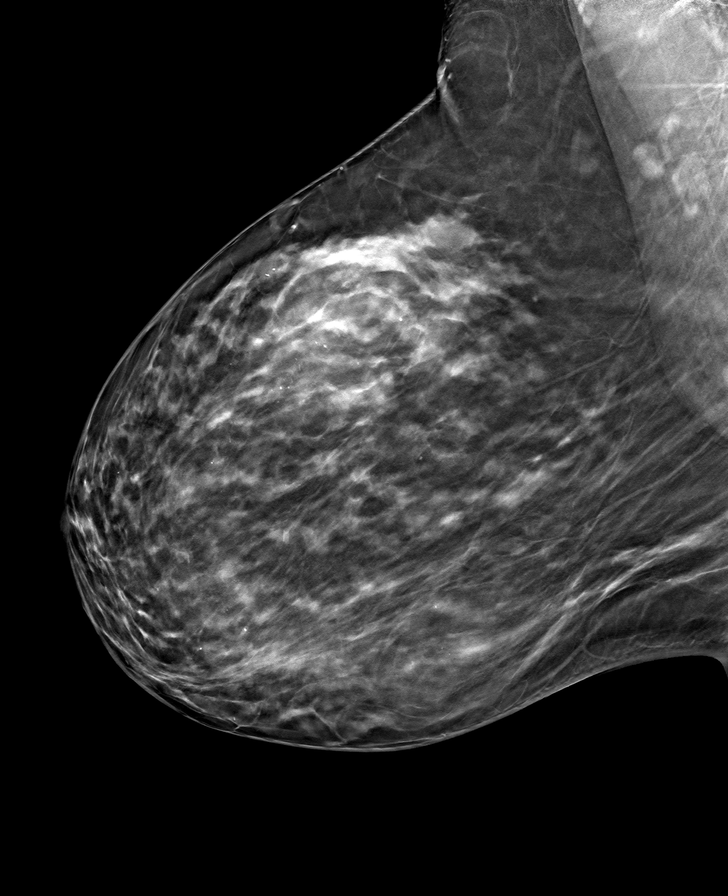

[L MLO tomo · tomo slice 38/75.0]
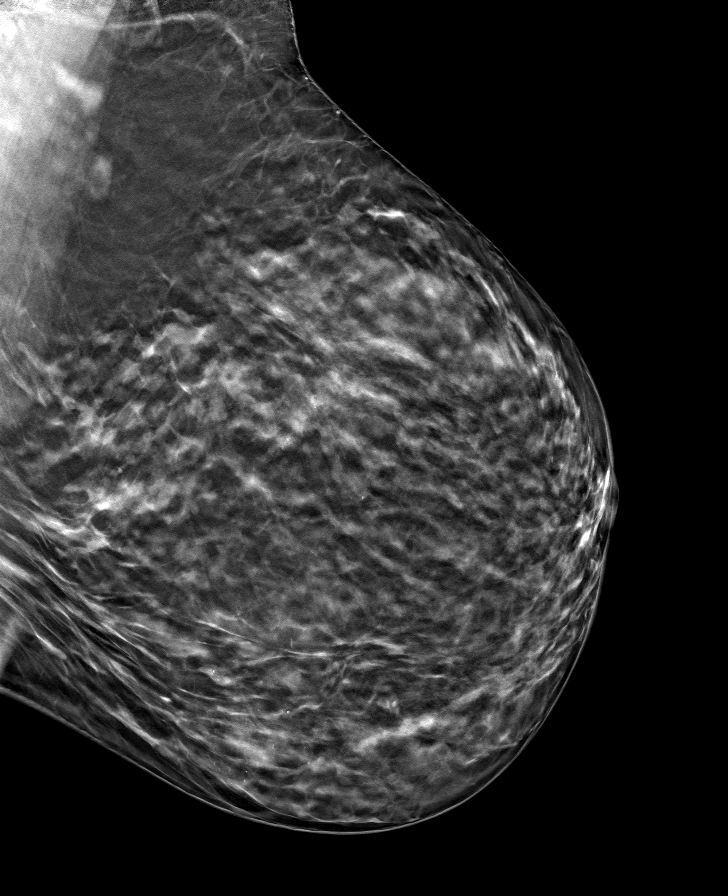

[R CC tomo · tomo slice 34/67.0]
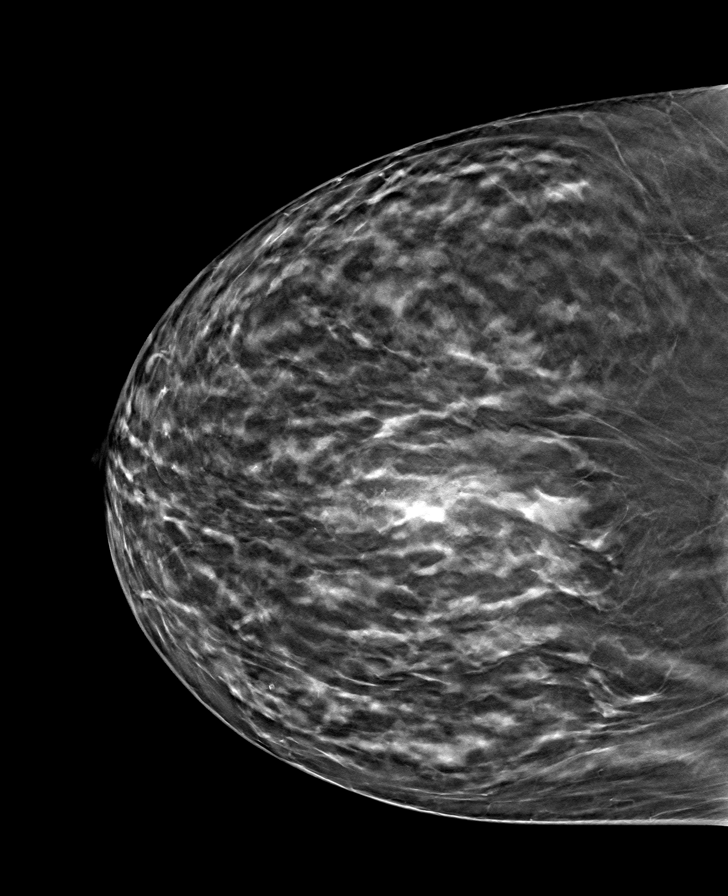

[8 of 24 positions shown; findings below may reference images not displayed]

ACR Breast Density Category c: The breast tissue is heterogeneously
dense, which may obscure small masses.
FINDINGS: There are no findings suspicious for malignancy. The images were
evaluated with computer-aided detection.
IMPRESSION: No mammographic evidence of malignancy. A result letter of this
screening mammogram will be mailed directly to the patient.

RECOMMENDATION:
Screening mammogram in one year. (Code:T4-5-GWO)

BI-RADS CATEGORY  1: Negative.
